# Patient Record
Sex: Female | Born: 1995 | Race: White | Hispanic: No | Marital: Single | Smoking: Current some day smoker
Health system: Southern US, Community
[De-identification: ages and names within clinical notes are randomized; demographics above are authoritative.]

---
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
## 2013-07-19 ENCOUNTER — Ambulatory Visit (INDEPENDENT_AMBULATORY_CARE_PROVIDER_SITE_OTHER): Payer: BC Managed Care – PPO | Admitting: Physician Assistant

## 2013-07-19 ENCOUNTER — Telehealth: Payer: Self-pay | Admitting: Physician Assistant

## 2013-07-19 VITALS — BP 110/72 | HR 78 | Temp 99.0°F | Resp 16 | Ht 62.0 in | Wt 112.2 lb

## 2013-07-19 DIAGNOSIS — R1031 Right lower quadrant pain: Secondary | ICD-10-CM

## 2013-07-19 LAB — COMPREHENSIVE METABOLIC PANEL
ALT: 10 U/L (ref 0–35)
AST: 15 U/L (ref 0–37)
Albumin: 4.1 g/dL (ref 3.5–5.2)
Alkaline Phosphatase: 41 U/L (ref 39–117)
BILIRUBIN TOTAL: 0.5 mg/dL (ref 0.2–1.2)
BUN: 6 mg/dL (ref 6–23)
CO2: 24 meq/L (ref 19–32)
Calcium: 9.3 mg/dL (ref 8.4–10.5)
Chloride: 105 mEq/L (ref 96–112)
Creat: 0.74 mg/dL (ref 0.50–1.10)
Glucose, Bld: 91 mg/dL (ref 70–99)
POTASSIUM: 4.5 meq/L (ref 3.5–5.3)
Sodium: 136 mEq/L (ref 135–145)
Total Protein: 6.6 g/dL (ref 6.0–8.3)

## 2013-07-19 LAB — CBC
HCT: 38.7 % (ref 36.0–46.0)
HEMOGLOBIN: 13.1 g/dL (ref 12.0–15.0)
MCH: 30.5 pg (ref 26.0–34.0)
MCHC: 33.9 g/dL (ref 30.0–36.0)
MCV: 90 fL (ref 78.0–100.0)
Platelets: 323 10*3/uL (ref 150–400)
RBC: 4.3 MIL/uL (ref 3.87–5.11)
RDW: 12.6 % (ref 11.5–15.5)
WBC: 5.1 10*3/uL (ref 4.0–10.5)

## 2013-07-19 LAB — POCT URINALYSIS DIPSTICK
BILIRUBIN UA: NEGATIVE
GLUCOSE UA: NEGATIVE
KETONES UA: NEGATIVE
Nitrite, UA: NEGATIVE
Protein, UA: NEGATIVE
RBC UA: NEGATIVE
SPEC GRAV UA: 1.01
UROBILINOGEN UA: 0.2
pH, UA: 6

## 2013-07-19 LAB — POCT URINE PREGNANCY: Preg Test, Ur: NEGATIVE

## 2013-07-19 MED ORDER — HYDROCODONE-ACETAMINOPHEN 5-325 MG PO TABS: 1.0000 | ORAL_TABLET | Freq: Four times a day (QID) | ORAL | Status: DC | PRN

## 2013-07-19 NOTE — Patient Instructions (Signed)
Use the pain medicine every 6 hours as needed (take with some food)  Stay hydrated by drinking water  I will call you with lab results and let you know if we need to do anything based on that today  If any of your symptoms are worsening (worsening abdominal pain, fever, nausea/vomiting) please call us, or if we are closed go to the emergency department  Plan to recheck at 8am tomorrow unless you hear otherwise from us.  If no better or worsening tomorrow, we may need to do imagina (CT or ultrasound)

## 2013-07-19 NOTE — Telephone Encounter (Signed)
error 

## 2013-07-19 NOTE — Progress Notes (Signed)
Subjective:    Patient ID: Rebekah Avery, female    DOB: Oct 14, 1995, 18 y.o.   MRN: [id number]  HPI   Rebekah Avery is a very pleasant 18 yr old female here with onset of RLQ abdominal pain yesterday.  This happened rather suddenly at work yesterday but has been worsening since.  She only has pain in the RLQ.  Woke up in the night with pain.  Took ibuprofen with some relief.  Laying down improves the pain somewhat.  She denies NVD.  She has not eaten today because she has not felt hungry.  No fever - temp 44F at triage - mom states "her normal is 58F".  Pt has never had pain like this before.  She denies urinary symptoms.  She denies abnormal vaginal discharge or bleeding.  She is sexually active with 1 female partner - no condoms, but OCPs.  LMP 07/05/13 - regular every month.  No concern for STI.  Last tested 4 months ago, same partner.     Review of Systems  Constitutional: Positive for appetite change and fatigue. Negative for fever and chills.  HENT: Negative.   Respiratory: Negative for cough, shortness of breath and wheezing.   Gastrointestinal: Positive for abdominal pain. Negative for nausea, vomiting and diarrhea.  Genitourinary: Negative for dysuria, flank pain, vaginal bleeding and vaginal discharge.  Skin: Negative.   Neurological: Negative for headaches.       Objective:   Physical Exam  Vitals reviewed. Constitutional: She is oriented to person, place, and time. She appears well-developed and well-nourished. No distress.  HENT:  Head: Normocephalic and atraumatic.  Eyes: Conjunctivae are normal. No scleral icterus.  Neck: Neck supple.  Cardiovascular: Normal rate, regular rhythm and normal heart sounds.   Pulmonary/Chest: Effort normal and breath sounds normal. She has no wheezes. She has no rales.  Abdominal: Soft. Bowel sounds are normal. There is tenderness in the right upper quadrant and right lower quadrant. There is tenderness at McBurney's point. There is no rigidity, no  rebound, no guarding and no CVA tenderness.  Significant RLQ tenderness; slight RUQ tenderness as well; no rebound but +Rosving  Genitourinary: There is no rash, tenderness or lesion on the right labia. There is no rash, tenderness or lesion on the left labia. Cervix exhibits no motion tenderness. Right adnexum displays no mass, no tenderness and no fullness. Left adnexum displays no mass, no tenderness and no fullness. No bleeding around the vagina.  Lymphadenopathy:    She has no cervical adenopathy.  Neurological: She is alert and oriented to person, place, and time.  Skin: Skin is warm and dry.  Psychiatric: She has a normal mood and affect. Her behavior is normal.    Results for orders placed in visit on 07/19/13  CBC      Result Value Ref Range   WBC 5.1  4.0 - 10.5 K/uL   RBC 4.30  3.87 - 5.11 MIL/uL   Hemoglobin 13.1  12.0 - 15.0 g/dL   HCT 40.9  81.1 - 91.4 %   MCV 90.0  78.0 - 100.0 fL   MCH 30.5  26.0 - 34.0 pg   MCHC 33.9  30.0 - 36.0 g/dL   RDW 78.2  95.6 - 21.3 %   Platelets 323  150 - 400 K/uL  COMPREHENSIVE METABOLIC PANEL      Result Value Ref Range   Sodium 136  135 - 145 mEq/L   Potassium 4.5  3.5 - 5.3 mEq/L   Chloride  105  96 - 112 mEq/L   CO2 24  19 - 32 mEq/L   Glucose, Bld 91  70 - 99 mg/dL   BUN 6  6 - 23 mg/dL   Creat 4.090.74  8.110.50 - 9.141.10 mg/dL   Total Bilirubin 0.5  0.2 - 1.2 mg/dL   Alkaline Phosphatase 41  39 - 117 U/L   AST 15  0 - 37 U/L   ALT 10  0 - 35 U/L   Total Protein 6.6  6.0 - 8.3 g/dL   Albumin 4.1  3.5 - 5.2 g/dL   Calcium 9.3  8.4 - 78.210.5 mg/dL  POCT URINE PREGNANCY      Result Value Ref Range   Preg Test, Ur Negative    POCT URINALYSIS DIPSTICK      Result Value Ref Range   Color, UA yellow     Clarity, UA clear     Glucose, UA neg     Bilirubin, UA neg     Ketones, UA neg     Spec Grav, UA 1.010     Blood, UA neg     pH, UA 6.0     Protein, UA neg     Urobilinogen, UA 0.2     Nitrite, UA neg     Leukocytes, UA Trace          Assessment & Plan:  RLQ abdominal pain - Plan: POCT urine pregnancy, POCT urinalysis dipstick, CBC, Comprehensive metabolic panel, HYDROcodone-acetaminophen (NORCO) 5-325 MG per tablet   Rebekah Avery is a very pleasant 18 yr old female here with 1 day of RLQ pain.  On exam she is very tender in the RLQ.  +Rosving.  +anorexia today, but no NV.  She is afebrile, CBC and CMP normal.  Hcg is neg and urine is clear.  Some concern for early appendicitis - but I do not feel that she warrants a CT scan at this point.  Ddx could include ovarian cyst or torsion, although pt MUCH more tender on abdominal exam than bimanual.  Discussed with pt by phone that with normal labs, I think we can watch symptoms closely.  Norco for pain.  Stay hydrated.  She will RTC first thing tomorrow morning to recheck with Rhoderick MoodyHeather Marte PA-C.  If worsening or not improving, would proceed with CT vs US.  We discussed symptoms that would prompt her to seek care sooner   E. Frances FurbishElizabeth Arin Vanosdol MHS, PA-C [facility] 7/12/20154:09 PM

## 2013-07-20 ENCOUNTER — Ambulatory Visit (HOSPITAL_COMMUNITY)
Admission: RE | Admit: 2013-07-20 | Discharge: 2013-07-20 | Disposition: A | Discharge status: Home / Self Care | Payer: BC Managed Care – PPO | Source: Ambulatory Visit | Attending: Physician Assistant | Admitting: Physician Assistant

## 2013-07-20 ENCOUNTER — Ambulatory Visit (INDEPENDENT_AMBULATORY_CARE_PROVIDER_SITE_OTHER): Payer: BC Managed Care – PPO | Admitting: Physician Assistant

## 2013-07-20 ENCOUNTER — Telehealth: Payer: Self-pay | Admitting: Physician Assistant

## 2013-07-20 ENCOUNTER — Encounter (HOSPITAL_COMMUNITY): Payer: Self-pay

## 2013-07-20 VITALS — BP 104/60 | HR 68 | Temp 98.3°F | Resp 16 | Ht 62.0 in | Wt 141.6 lb

## 2013-07-20 DIAGNOSIS — R1115 Cyclical vomiting syndrome unrelated to migraine: Secondary | ICD-10-CM

## 2013-07-20 DIAGNOSIS — R109 Unspecified abdominal pain: Secondary | ICD-10-CM

## 2013-07-20 DIAGNOSIS — R1031 Right lower quadrant pain: Secondary | ICD-10-CM

## 2013-07-20 DIAGNOSIS — R1110 Vomiting, unspecified: Secondary | ICD-10-CM

## 2013-07-20 LAB — CBC WITH DIFFERENTIAL/PLATELET
Basophils Absolute: 0 10*3/uL (ref 0.0–0.1)
Eosinophils Absolute: 0 10*3/uL (ref 0.0–0.7)
HCT: 41 % (ref 36.0–46.0)
Hemoglobin: 12.9 g/dL (ref 12.0–15.0)
Lymphocytes Relative: 18 % (ref 12–46)
Lymphs Abs: 1.8 10*3/uL (ref 0.7–4.0)
MCH: 30 pg (ref 26.0–34.0)
MCHC: 31.5 g/dL (ref 30.0–36.0)
MCV: 95.3 fL (ref 78.0–100.0)
Monocytes Absolute: 0.5 10*3/uL (ref 0.1–1.0)
Monocytes Relative: 5 % (ref 3–12)
Neutro Abs: 7.9 10*3/uL — ABNORMAL HIGH (ref 1.7–7.7)
Neutrophils Relative %: 77 % (ref 43–77)
Platelets: 269 10*3/uL (ref 150–400)
RBC: 4.3 MIL/uL (ref 3.87–5.11)
RDW: 12 % (ref 11.5–15.5)
WBC: 10.2 10*3/uL (ref 4.0–10.5)

## 2013-07-20 LAB — POCT URINALYSIS DIPSTICK
Bilirubin, UA: NEGATIVE
Blood, UA: NEGATIVE
Glucose, UA: NEGATIVE
Ketones, UA: 40
Leukocytes, UA: NEGATIVE
Nitrite, UA: NEGATIVE
Protein, UA: NEGATIVE
Spec Grav, UA: <=1.005
Urobilinogen, UA: 0.2
pH, UA: 6.5

## 2013-07-20 LAB — POCT UA - MICROSCOPIC ONLY
Bacteria, U Microscopic: NEGATIVE
Casts, Ur, LPF, POC: NEGATIVE
Crystals, Ur, HPF, POC: NEGATIVE
Mucus, UA: NEGATIVE
RBC, urine, microscopic: NEGATIVE
WBC, Ur, HPF, POC: NEGATIVE
Yeast, UA: NEGATIVE

## 2013-07-20 MED ORDER — CIPROFLOXACIN HCL 500 MG PO TABS: 500.0000 mg | ORAL_TABLET | Freq: Two times a day (BID) | ORAL | Status: DC

## 2013-07-20 MED ORDER — ONDANSETRON 4 MG PO TBDP
4.0000 mg | ORAL_TABLET | Freq: Once | ORAL | Status: AC
  Administered 2013-07-20: 4 mg via ORAL

## 2013-07-20 MED ORDER — CEFTRIAXONE SODIUM 1 G IJ SOLR
1.0000 g | Freq: Once | INTRAMUSCULAR | Status: AC
  Administered 2013-07-20: 1 g via INTRAMUSCULAR

## 2013-07-20 MED ORDER — IOHEXOL 300 MG/ML  SOLN
100.0000 mL | Freq: Once | INTRAMUSCULAR | Status: AC | PRN
  Administered 2013-07-20: 100 mL via INTRAVENOUS

## 2013-07-20 MED ORDER — ONDANSETRON 4 MG PO TBDP: 4.0000 mg | ORAL_TABLET | Freq: Three times a day (TID) | ORAL | Status: DC | PRN

## 2013-07-20 NOTE — Progress Notes (Deleted)
   Subjective:    Patient ID: Rebekah SkeensKira Avery, female    DOB: 01-Mar-1995, 18 y.o.   MRN: [id number]  HPI 18 year old     Review of Systems     Objective:   Physical Exam        Assessment & Plan:

## 2013-07-20 NOTE — Progress Notes (Signed)
   Subjective:    Patient ID: Rebekah Avery, female    DOB: 1995-02-13, 18 y.o.   MRN: [id number]  HPI 18 year old female presents today for recheck of RLQ abdominal pain. Symptoms started on 7/11 and have been progressively worsening. Was evaluated yesterday and since patient afebrile without N/V/F/C, conservative tx and close f/u implemented.  Today, she has worsening RLQ pain and anorexia. Continues to be afebrile without N/V, but mother is concerned about the worsening pain.    Labs yesterday WNL; normal CBC, CMET, UA.  HCG neg.     Review of Systems  Constitutional: Positive for appetite change (anorexia). Negative for fever and chills.  Gastrointestinal: Positive for abdominal pain (RLQ). Negative for nausea and vomiting.       Objective:   Physical Exam  Constitutional: She is oriented to person, place, and time. She appears well-developed and well-nourished.  HENT:  Head: Normocephalic and atraumatic.  Right Ear: External ear normal.  Left Ear: External ear normal.  Eyes: Conjunctivae are normal.  Neck: Normal range of motion.  Cardiovascular: Normal rate.   Pulmonary/Chest: Effort normal.  Abdominal: There is tenderness in the right lower quadrant. There is guarding. There is no rigidity and no rebound.  Neurological: She is alert and oriented to person, place, and time.  Psychiatric: She has a normal mood and affect. Her behavior is normal. Judgment and thought content normal.          Assessment & Plan:  RLQ abdominal pain - Plan: CANCELED: CT Abdomen Pelvis W Contrast  Sent for CT scan abdo/pelvis to r/o appendicitis. Will await results to determine tx.

## 2013-07-20 NOTE — Addendum Note (Signed)
Addended by: Nelva NayMARTE, Trevion Hoben M on: 07/20/2013 04:03 PM   Modules accepted: Orders, Level of Service

## 2013-07-20 NOTE — Telephone Encounter (Signed)
Spoke with patient and her mother. CT shows possible early pyelonephritis. Patient has no dysuria or frequency but now has slight right sided flank pain and chills. She will return to have a UA/urine culture and CBC checked today. Will plan to go ahead and start on Cipro 500 mg bid x 7 days and recheck in 24 hours

## 2013-07-20 NOTE — Progress Notes (Signed)
Patient returned for recheck after CT. Has developed nausea with 2 episodes of vomiting. Also c/o right sided flank pain and associated chills.    On exam she does have mild right CVA tenderness and low grade temp at 99.0.   Will treat for suspected early pyelonephritis with Rocephin 1 gram IM today and Cipro 500 mg bid x 7 days. Recheck in 24 hours. Out of work tomorrow. To ER if acutely worse through the night.

## 2013-07-22 LAB — URINE CULTURE
Colony Count: NO GROWTH
Organism ID, Bacteria: NO GROWTH

## 2014-06-26 ENCOUNTER — Ambulatory Visit (INDEPENDENT_AMBULATORY_CARE_PROVIDER_SITE_OTHER): Payer: 59

## 2014-06-26 ENCOUNTER — Ambulatory Visit (INDEPENDENT_AMBULATORY_CARE_PROVIDER_SITE_OTHER): Payer: 59 | Admitting: Family Medicine

## 2014-06-26 VITALS — BP 124/78 | HR 73 | Temp 98.4°F | Resp 16 | Ht 62.0 in | Wt 132.0 lb

## 2014-06-26 DIAGNOSIS — M79675 Pain in left toe(s): Secondary | ICD-10-CM | POA: Diagnosis not present

## 2014-06-26 DIAGNOSIS — M25572 Pain in left ankle and joints of left foot: Secondary | ICD-10-CM

## 2014-06-26 NOTE — Patient Instructions (Signed)
Your left foot xray did not show any acute injury.   Acute Ankle Sprain with Phase I Rehab An acute ankle sprain is a partial or complete tear in one or more of the ligaments of the ankle due to traumatic injury. The severity of the injury depends on both the number of ligaments sprained and the grade of sprain. There are 3 grades of sprains.   A grade 1 sprain is a mild sprain. There is a slight pull without obvious tearing. There is no loss of strength, and the muscle and ligament are the correct length.  A grade 2 sprain is a moderate sprain. There is tearing of fibers within the substance of the ligament where it connects two bones or two cartilages. The length of the ligament is increased, and there is usually decreased strength.  A grade 3 sprain is a complete rupture of the ligament and is uncommon. In addition to the grade of sprain, there are three types of ankle sprains.  Lateral ankle sprains: This is a sprain of one or more of the three ligaments on the outer side (lateral) of the ankle. These are the most common sprains. Medial ankle sprains: There is one large triangular ligament of the inner side (medial) of the ankle that is susceptible to injury. Medial ankle sprains are less common. Syndesmosis, "high ankle," sprains: The syndesmosis is the ligament that connects the two bones of the lower leg. Syndesmosis sprains usually only occur with very severe ankle sprains. SYMPTOMS  Pain, tenderness, and swelling in the ankle, starting at the side of injury that may progress to the whole ankle and foot with time.  "Pop" or tearing sensation at the time of injury.  Bruising that may spread to the heel.  Impaired ability to walk soon after injury. CAUSES   Acute ankle sprains are caused by trauma placed on the ankle that temporarily forces or pries the anklebone (talus) out of its normal socket.  Stretching or tearing of the ligaments that normally hold the joint in place (usually  due to a twisting injury). RISK INCREASES WITH:  Previous ankle sprain.  Sports in which the foot may land awkwardly (i.e., basketball, volleyball, or soccer) or walking or running on uneven or rough surfaces.  Shoes with inadequate support to prevent sideways motion when stress occurs.  Poor strength and flexibility.  Poor balance skills.  Contact sports. PREVENTION   Warm up and stretch properly before activity.  Maintain physical fitness:  Ankle and leg flexibility, muscle strength, and endurance.  Cardiovascular fitness.  Balance training activities.  Use proper technique and have a coach correct improper technique.  Taping, protective strapping, bracing, or high-top tennis shoes may help prevent injury. Initially, tape is best; however, it loses most of its support function within 10 to 15 minutes.  Wear proper-fitted protective shoes (High-top shoes with taping or bracing is more effective than either alone).  Provide the ankle with support during sports and practice activities for 12 months following injury. PROGNOSIS   If treated properly, ankle sprains can be expected to recover completely; however, the length of recovery depends on the degree of injury.  A grade 1 sprain usually heals enough in 5 to 7 days to allow modified activity and requires an average of 6 weeks to heal completely.  A grade 2 sprain requires 6 to 10 weeks to heal completely.  A grade 3 sprain requires 12 to 16 weeks to heal.  A syndesmosis sprain often takes more than 3  months to heal. RELATED COMPLICATIONS   Frequent recurrence of symptoms may result in a chronic problem. Appropriately addressing the problem the first time decreases the frequency of recurrence and optimizes healing time. Severity of the initial sprain does not predict the likelihood of later instability.  Injury to other structures (bone, cartilage, or tendon).  A chronically unstable or arthritic ankle joint is a  possibility with repeated sprains. TREATMENT Treatment initially involves the use of ice, medication, and compression bandages to help reduce pain and inflammation. Ankle sprains are usually immobilized in a walking cast or boot to allow for healing. Crutches may be recommended to reduce pressure on the injury. After immobilization, strengthening and stretching exercises may be necessary to regain strength and a full range of motion. Surgery is rarely needed to treat ankle sprains. MEDICATION   Nonsteroidal anti-inflammatory medications, such as aspirin and ibuprofen (do not take for the first 3 days after injury or within 7 days before surgery), or other minor pain relievers, such as acetaminophen, are often recommended. Take these as directed by your caregiver. Contact your caregiver immediately if any bleeding, stomach upset, or signs of an allergic reaction occur from these medications.  Ointments applied to the skin may be helpful.  Pain relievers may be prescribed as necessary by your caregiver. Do not take prescription pain medication for longer than 4 to 7 days. Use only as directed and only as much as you need. HEAT AND COLD  Cold treatment (icing) is used to relieve pain and reduce inflammation for acute and chronic cases. Cold should be applied for 10 to 15 minutes every 2 to 3 hours for inflammation and pain and immediately after any activity that aggravates your symptoms. Use ice packs or an ice massage.  Heat treatment may be used before performing stretching and strengthening activities prescribed by your caregiver. Use a heat pack or a warm soak. SEEK IMMEDIATE MEDICAL CARE IF:   Pain, swelling, or bruising worsens despite treatment.  You experience pain, numbness, discoloration, or coldness in the foot or toes.  New, unexplained symptoms develop (drugs used in treatment may produce side effects.) EXERCISES  PHASE I EXERCISES RANGE OF MOTION (ROM) AND STRETCHING EXERCISES -  Ankle Sprain, Acute Phase I, Weeks 1 to 2 These exercises may help you when beginning to restore flexibility in your ankle. You will likely work on these exercises for the 1 to 2 weeks after your injury. Once your physician, physical therapist, or athletic trainer sees adequate progress, he or she will advance your exercises. While completing these exercises, remember:   Restoring tissue flexibility helps normal motion to return to the joints. This allows healthier, less painful movement and activity.  An effective stretch should be held for at least 30 seconds.  A stretch should never be painful. You should only feel a gentle lengthening or release in the stretched tissue. RANGE OF MOTION - Dorsi/Plantar Flexion  While sitting with your right / left knee straight, draw the top of your foot upwards by flexing your ankle. Then reverse the motion, pointing your toes downward.  Hold each position for __________ seconds.  After completing your first set of exercises, repeat this exercise with your knee bent. Repeat __________ times. Complete this exercise __________ times per day.  RANGE OF MOTION - Ankle Alphabet  Imagine your right / left big toe is a pen.  Keeping your hip and knee still, write out the entire alphabet with your "pen." Make the letters as large as you  can without increasing any discomfort. Repeat __________ times. Complete this exercise __________ times per day.  STRENGTHENING EXERCISES - Ankle Sprain, Acute -Phase I, Weeks 1 to 2 These exercises may help you when beginning to restore strength in your ankle. You will likely work on these exercises for 1 to 2 weeks after your injury. Once your physician, physical therapist, or athletic trainer sees adequate progress, he or she will advance your exercises. While completing these exercises, remember:   Muscles can gain both the endurance and the strength needed for everyday activities through controlled exercises.  Complete  these exercises as instructed by your physician, physical therapist, or athletic trainer. Progress the resistance and repetitions only as guided.  You may experience muscle soreness or fatigue, but the pain or discomfort you are trying to eliminate should never worsen during these exercises. If this pain does worsen, stop and make certain you are following the directions exactly. If the pain is still present after adjustments, discontinue the exercise until you can discuss the trouble with your clinician. STRENGTH - Dorsiflexors  Secure a rubber exercise band/tubing to a fixed object (i.e., table, pole) and loop the other end around your right / left foot.  Sit on the floor facing the fixed object. The band/tubing should be slightly tense when your foot is relaxed.  Slowly draw your foot back toward you using your ankle and toes.  Hold this position for __________ seconds. Slowly release the tension in the band and return your foot to the starting position. Repeat __________ times. Complete this exercise __________ times per day.  STRENGTH - Plantar-flexors   Sit with your right / left leg extended. Holding onto both ends of a rubber exercise band/tubing, loop it around the ball of your foot. Keep a slight tension in the band.  Slowly push your toes away from you, pointing them downward.  Hold this position for __________ seconds. Return slowly, controlling the tension in the band/tubing. Repeat __________ times. Complete this exercise __________ times per day.  STRENGTH - Ankle Eversion  Secure one end of a rubber exercise band/tubing to a fixed object (table, pole). Loop the other end around your foot just before your toes.  Place your fists between your knees. This will focus your strengthening at your ankle.  Drawing the band/tubing across your opposite foot, slowly, pull your little toe out and up. Make sure the band/tubing is positioned to resist the entire motion.  Hold this  position for __________ seconds. Have your muscles resist the band/tubing as it slowly pulls your foot back to the starting position.  Repeat __________ times. Complete this exercise __________ times per day.  STRENGTH - Ankle Inversion  Secure one end of a rubber exercise band/tubing to a fixed object (table, pole). Loop the other end around your foot just before your toes.  Place your fists between your knees. This will focus your strengthening at your ankle.  Slowly, pull your big toe up and in, making sure the band/tubing is positioned to resist the entire motion.  Hold this position for __________ seconds.  Have your muscles resist the band/tubing as it slowly pulls your foot back to the starting position. Repeat __________ times. Complete this exercises __________ times per day.  STRENGTH - Towel Curls  Sit in a chair positioned on a non-carpeted surface.  Place your right / left foot on a towel, keeping your heel on the floor.  Pull the towel toward your heel by only curling your toes. Keep your  heel on the floor.  If instructed by your physician, physical therapist, or athletic trainer, add weight to the end of the towel. Repeat __________ times. Complete this exercise __________ times per day. Document Released: 07/26/2004 Document Revised: 05/11/2013 Document Reviewed: 04/08/2008 [facility] Patient Information 2015 [place], [place]. This information is not intended to replace advice given to you by your health care provider. Make sure you discuss any questions you have with your health care provider.

## 2014-06-26 NOTE — Progress Notes (Signed)
   Subjective:    Patient ID: Rebekah Avery, female    DOB: Nov 29, 1995, 19 y.o.   MRN: [id number]  Chief Complaint  Patient presents with  . Ankle Injury    Left/ 3 days ago   Medications, allergies, past medical history, surgical history, family history, social history and problem list reviewed and updated.   HPI  19 yof presents with left ankle pain.   At work 2 days ago tried stepping over bucket and when landed she inverted foot. Pain on outside ankle afterward. Continued working rest of shift.   She has a walking boot at home and wore all day yest. Also has ankle brace at home which she is wearing today. Able to bear weight gingerly. Walked into clinic today with mom's assistance.   Review of Systems No fevers, chills.     Objective:   Physical Exam  Constitutional: She is oriented to person, place, and time. She appears well-developed and well-nourished.  Non-toxic appearance. She does not have a sickly appearance. She does not appear ill. No distress.  BP 124/78 mmHg  Pulse 73  Temp(Src) 98.4 F (36.9 C) (Oral)  Resp 16  Ht 5\' 2"  (1.575 m)  Wt 132 lb (59.875 kg)  BMI 24.14 kg/m2  SpO2 99%  LMP 06/06/2014   Musculoskeletal:       Left ankle: She exhibits swelling. She exhibits normal range of motion and no ecchymosis. Tenderness. Lateral malleolus tenderness found. No medial malleolus, no head of 5th metatarsal and no proximal fibula tenderness found. Achilles tendon normal.       Left foot: There is tenderness and bony tenderness.       Feet:  Mild ttp left lateral malleolus. No ttp over dorsum. No laxity on joint testing. Neg squeeze test. Moderate TTP over mid shaft 5th metatarsal.   Neurological: She is alert and oriented to person, place, and time.   [facility] reading (PRIMARY) by  Dr. Katrinka Blazing. Left foot findings: Normal. No acute injury.      Assessment & Plan:   19 yof presents with left ankle pain.   Left ankle pain Pain in toe of left foot - Plan: DG Foot  Complete Left --xray of left foot to r/o metatarsal fx, xrays negative --note off work till Tuesday --RICE, start rotating between heat/ice tomorrow --start light rom as tolerated next few days --ankle brace as needed --rtc one week if not improving  Donnajean Lopes, PA-C Physician Assistant-Certified [facility]  06/26/2014 10:05 PM

## 2014-06-27 ENCOUNTER — Encounter: Payer: Self-pay | Admitting: Family Medicine

## 2014-06-27 NOTE — Progress Notes (Signed)
History and physical examinations reviewed with PA McVeigh.  Xray reviewed during visit. Agree with assessment and plan. Kristi Paulita Fujita, M.D. [facility] [address] [place], [place]  [address] [phone] phone [phone] fax

## 2015-03-23 ENCOUNTER — Other Ambulatory Visit: Payer: Self-pay | Admitting: Obstetrics and Gynecology

## 2015-03-23 ENCOUNTER — Other Ambulatory Visit (HOSPITAL_COMMUNITY)
Admission: RE | Admit: 2015-03-23 | Discharge: 2015-03-23 | Disposition: A | Discharge status: Home / Self Care | Payer: 59 | Source: Ambulatory Visit | Attending: Obstetrics and Gynecology | Admitting: Obstetrics and Gynecology

## 2015-03-23 DIAGNOSIS — Z01419 Encounter for gynecological examination (general) (routine) without abnormal findings: Secondary | ICD-10-CM | POA: Diagnosis present

## 2015-03-24 LAB — CYTOLOGY - PAP

## 2015-09-03 IMAGING — CT CT ABD-PELV W/ CM
2 of 4 series · 16 of 46 positions shown, 18 images · IV contrast (APPLIED)
Comparison: None.

CLINICAL DATA: Right lower quadrant abdominal pain, progressive
since 07/18/2013.

EXAM:
CT ABDOMEN AND PELVIS WITH CONTRAST
TECHNIQUE: Multidetector CT imaging of the abdomen and pelvis was performed
using the standard protocol following bolus administration of
intravenous contrast.
CONTRAST:  100mL OMNIPAQUE IOHEXOL 300 MG/ML  SOLN

[Series 2: abd/ pelvis 5.0 i30f 1 · axial · 0.60mm/px · z∈[-717,-342]mm · 13 of 83 slices shown, 15 images]
[im 4/83  soft-tissue]
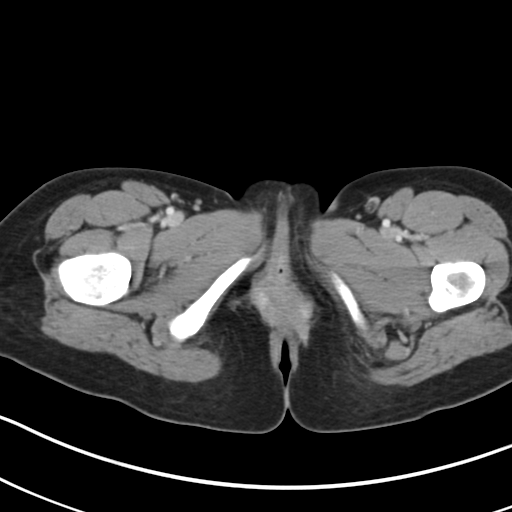
[im 4/83  bone]
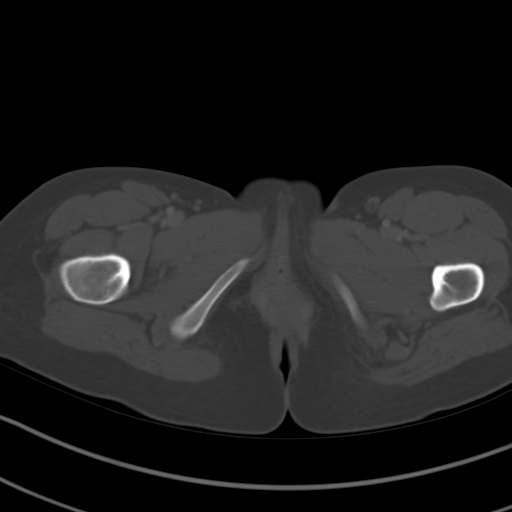
[im 10/83  soft-tissue]
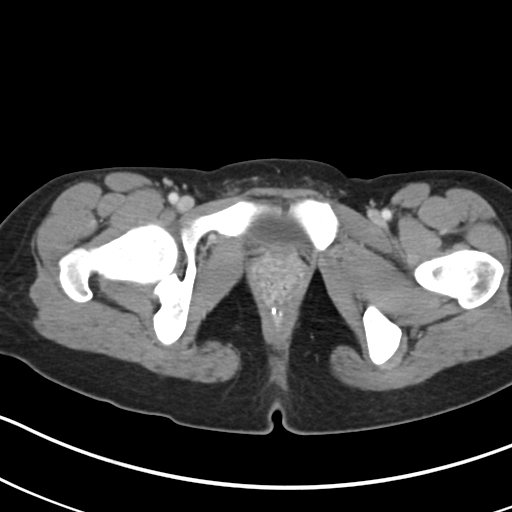
[im 16/83  soft-tissue]
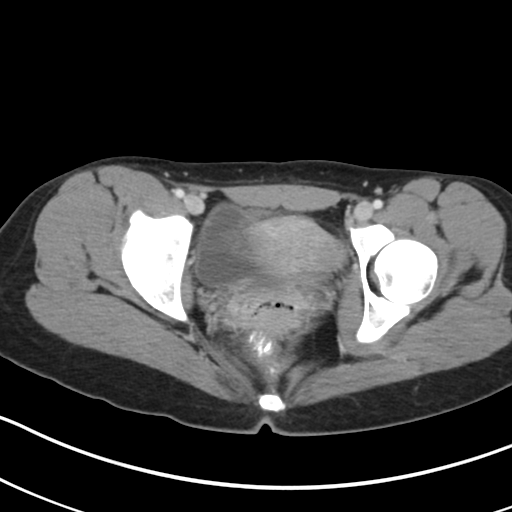
[im 23/83  soft-tissue]
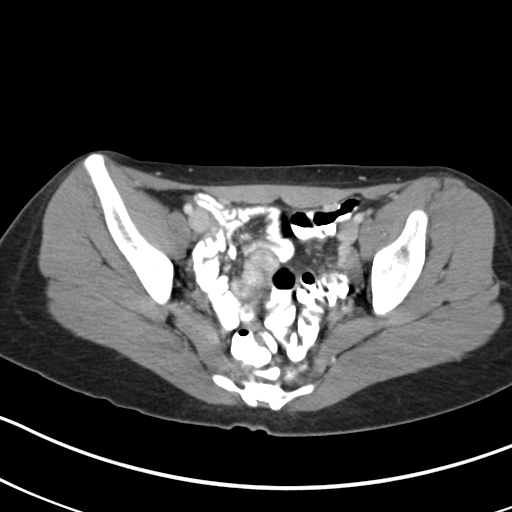
[im 29/83  soft-tissue]
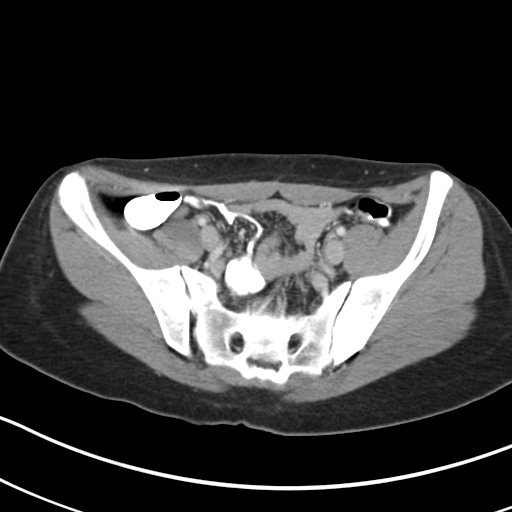
[im 35/83  soft-tissue]
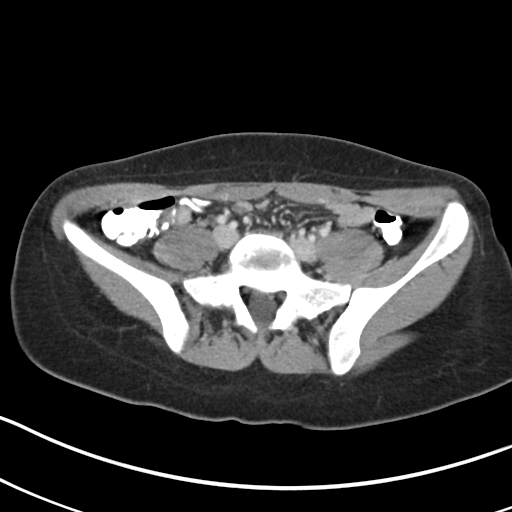
[im 42/83  soft-tissue]
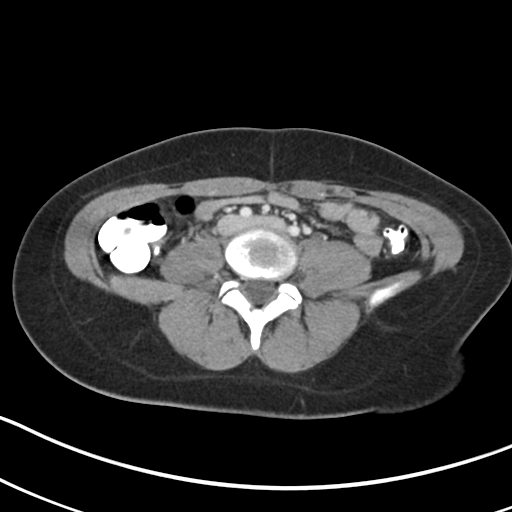
[im 48/83  soft-tissue]
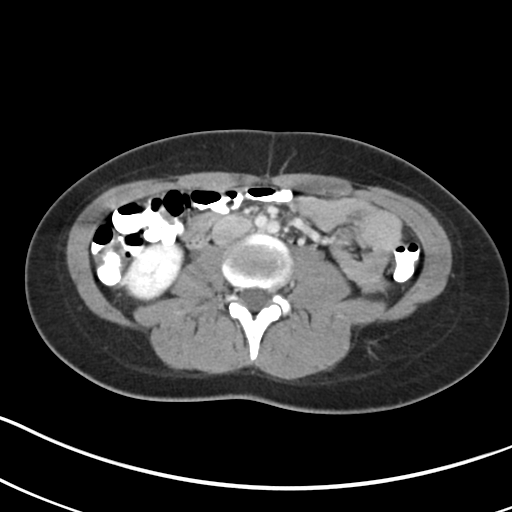
[im 54/83  soft-tissue]
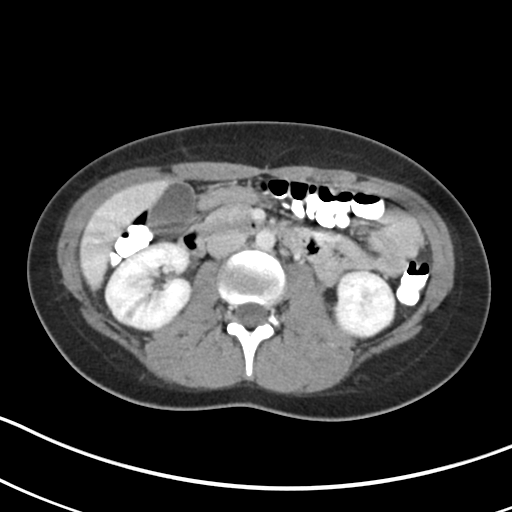
[im 54/83  bone]
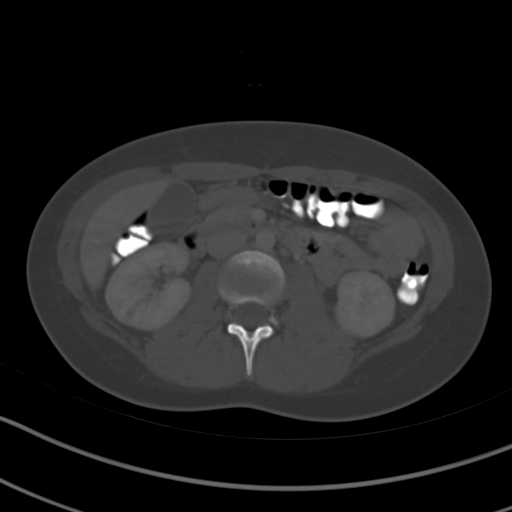
[im 60/83  soft-tissue]
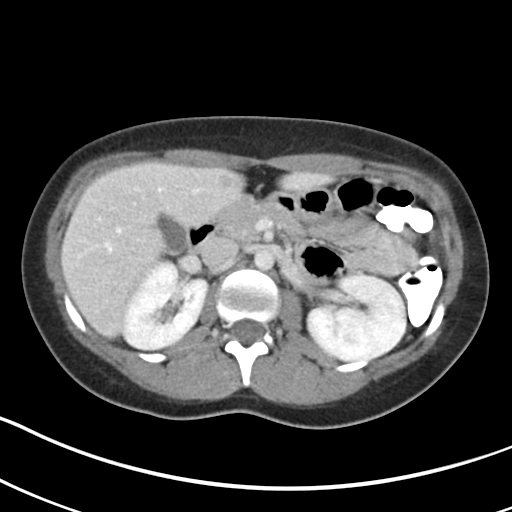
[im 67/83  soft-tissue]
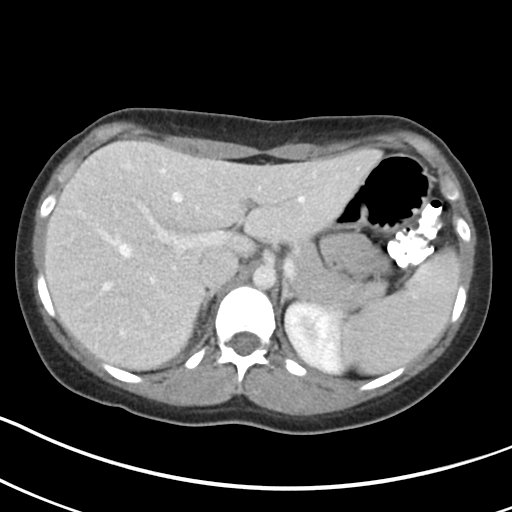
[im 73/83  soft-tissue]
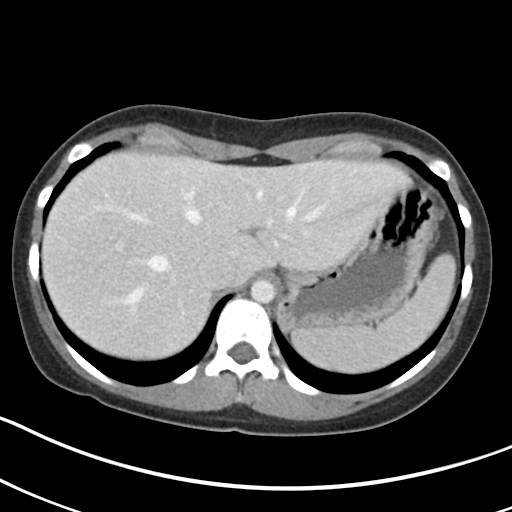
[im 79/83  soft-tissue]
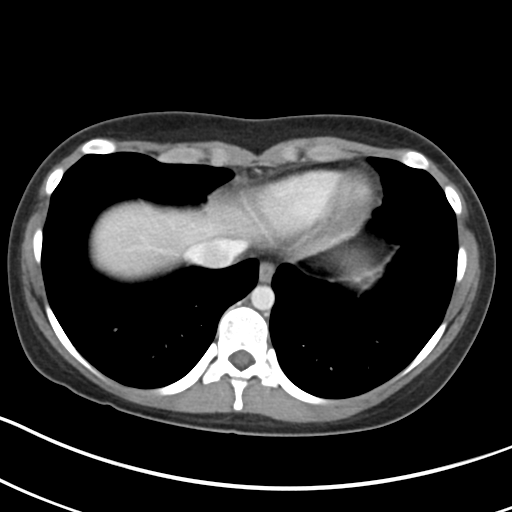

[Series 5: coronal soft tissue · coronal · 0.65mm/px · 3 of 61 slices shown]
[im 21/61  soft-tissue]
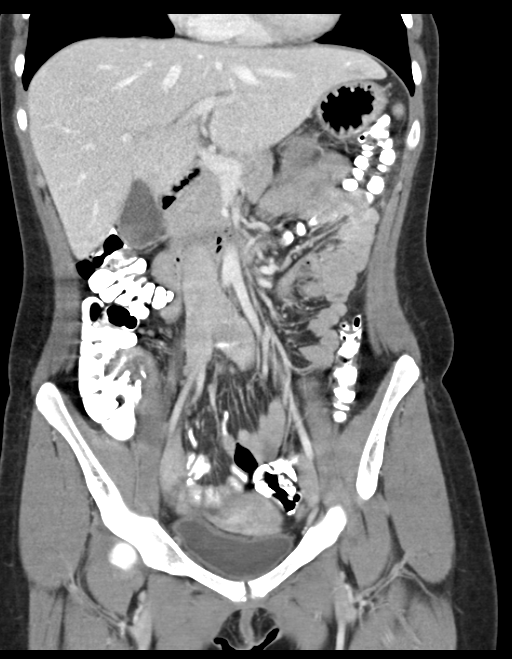
[im 27/61  soft-tissue]
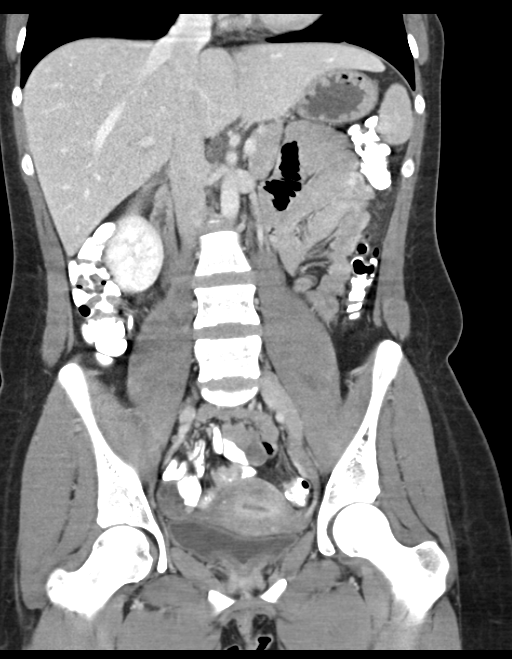
[im 34/61  soft-tissue]
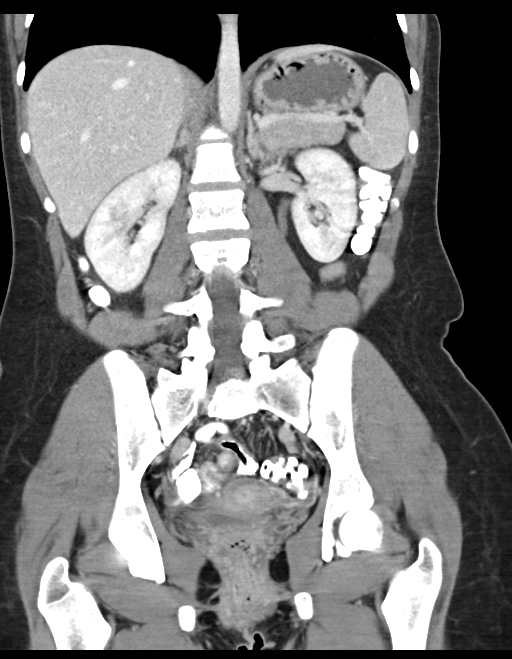

[16 of 46 positions shown; findings below may reference images not displayed]

FINDINGS: There are 2 streaky areas of abnormal decreased profusion in the mid
and lower pole of the right kidney. These are nonspecific but the
possibility of early pyelonephritis should be considered. There is
no perinephric soft tissue stranding. There is no hydronephrosis.

The liver, biliary tree, spleen, pancreas, and adrenal glands are
normal. Left kidney is normal.

The bowel is normal including the terminal ileum and appendix.
Uterus and ovaries are normal. Bladder is normal. There is no free
air or free fluid in the abdomen. There are no dilated loops of
bowel. No osseous abnormality.
IMPRESSION: Two subtle linear areas of decreased perfusion in the right kidney
which raise the possibility of early pyelonephritis. Otherwise,
normal exam.

## 2016-08-09 IMAGING — CR DG FOOT COMPLETE 3+V*L*
3 series · 3 of 3 positions shown · non-contrast
Comparison: None.

CLINICAL DATA: Pain following fall 3 days prior

EXAM:
LEFT FOOT - COMPLETE 3+ VIEW

[AP]
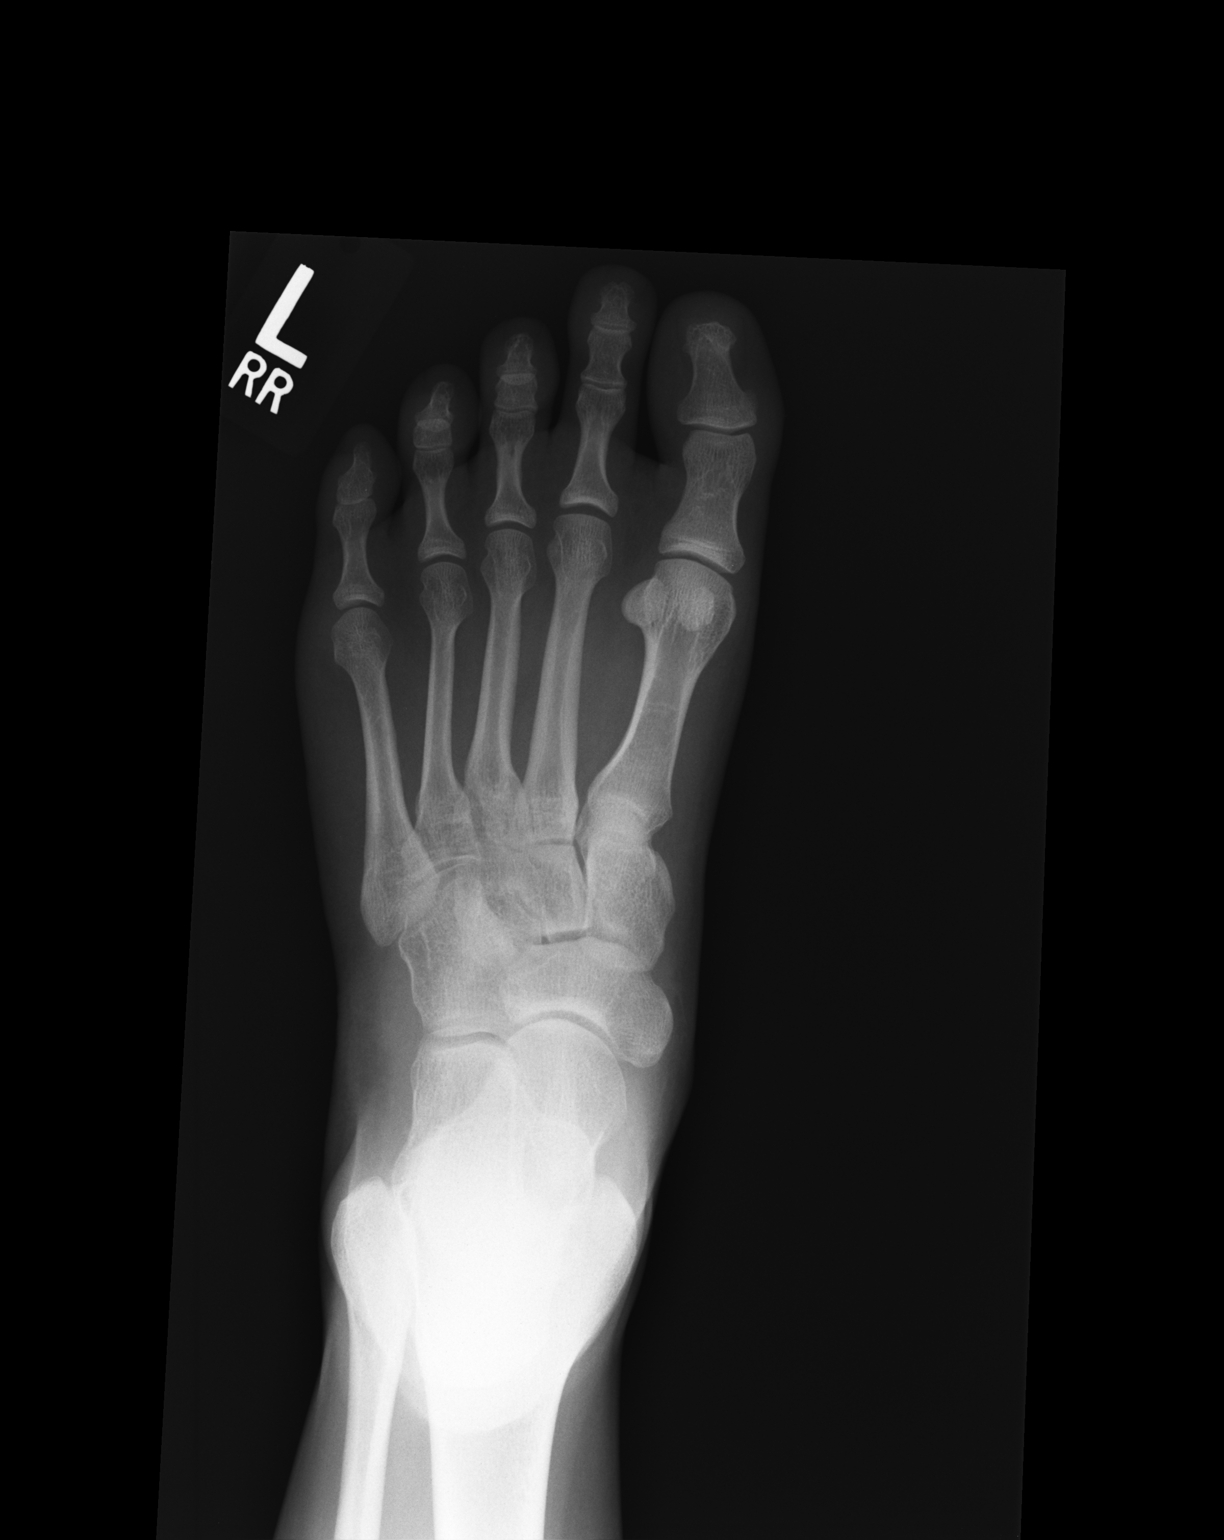

[ap obl int rot]
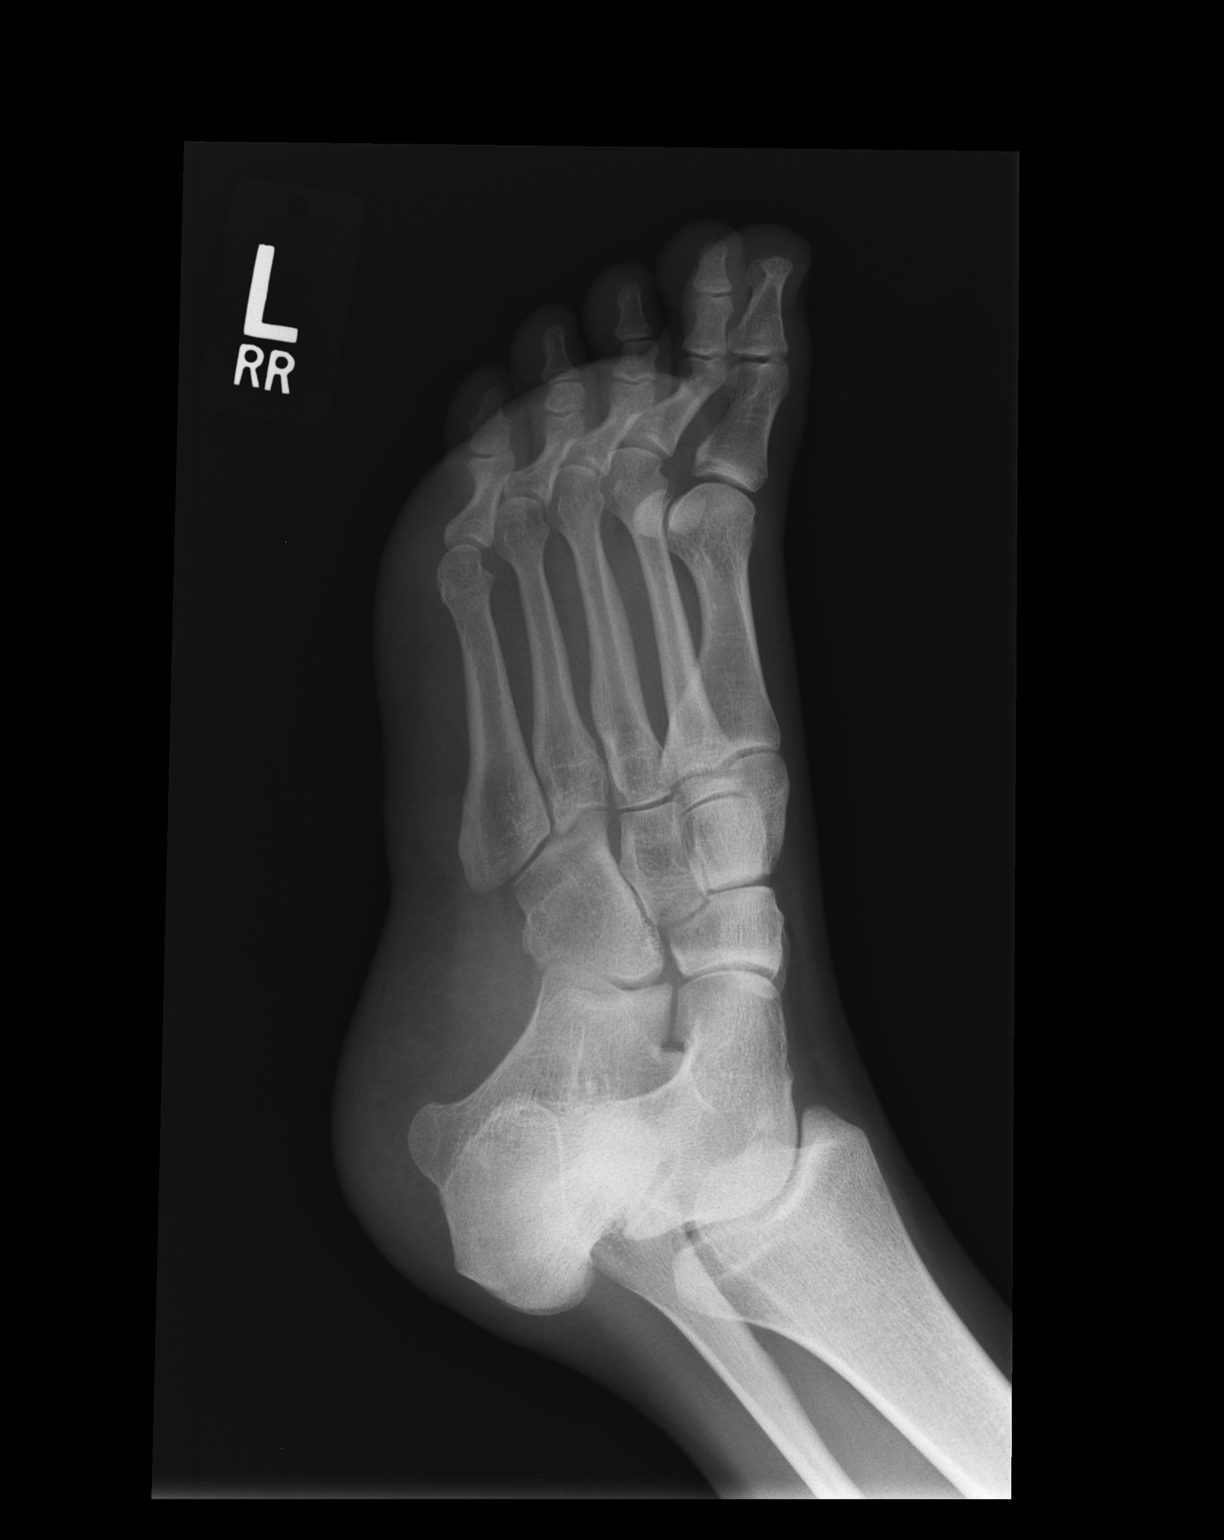

[lateral]
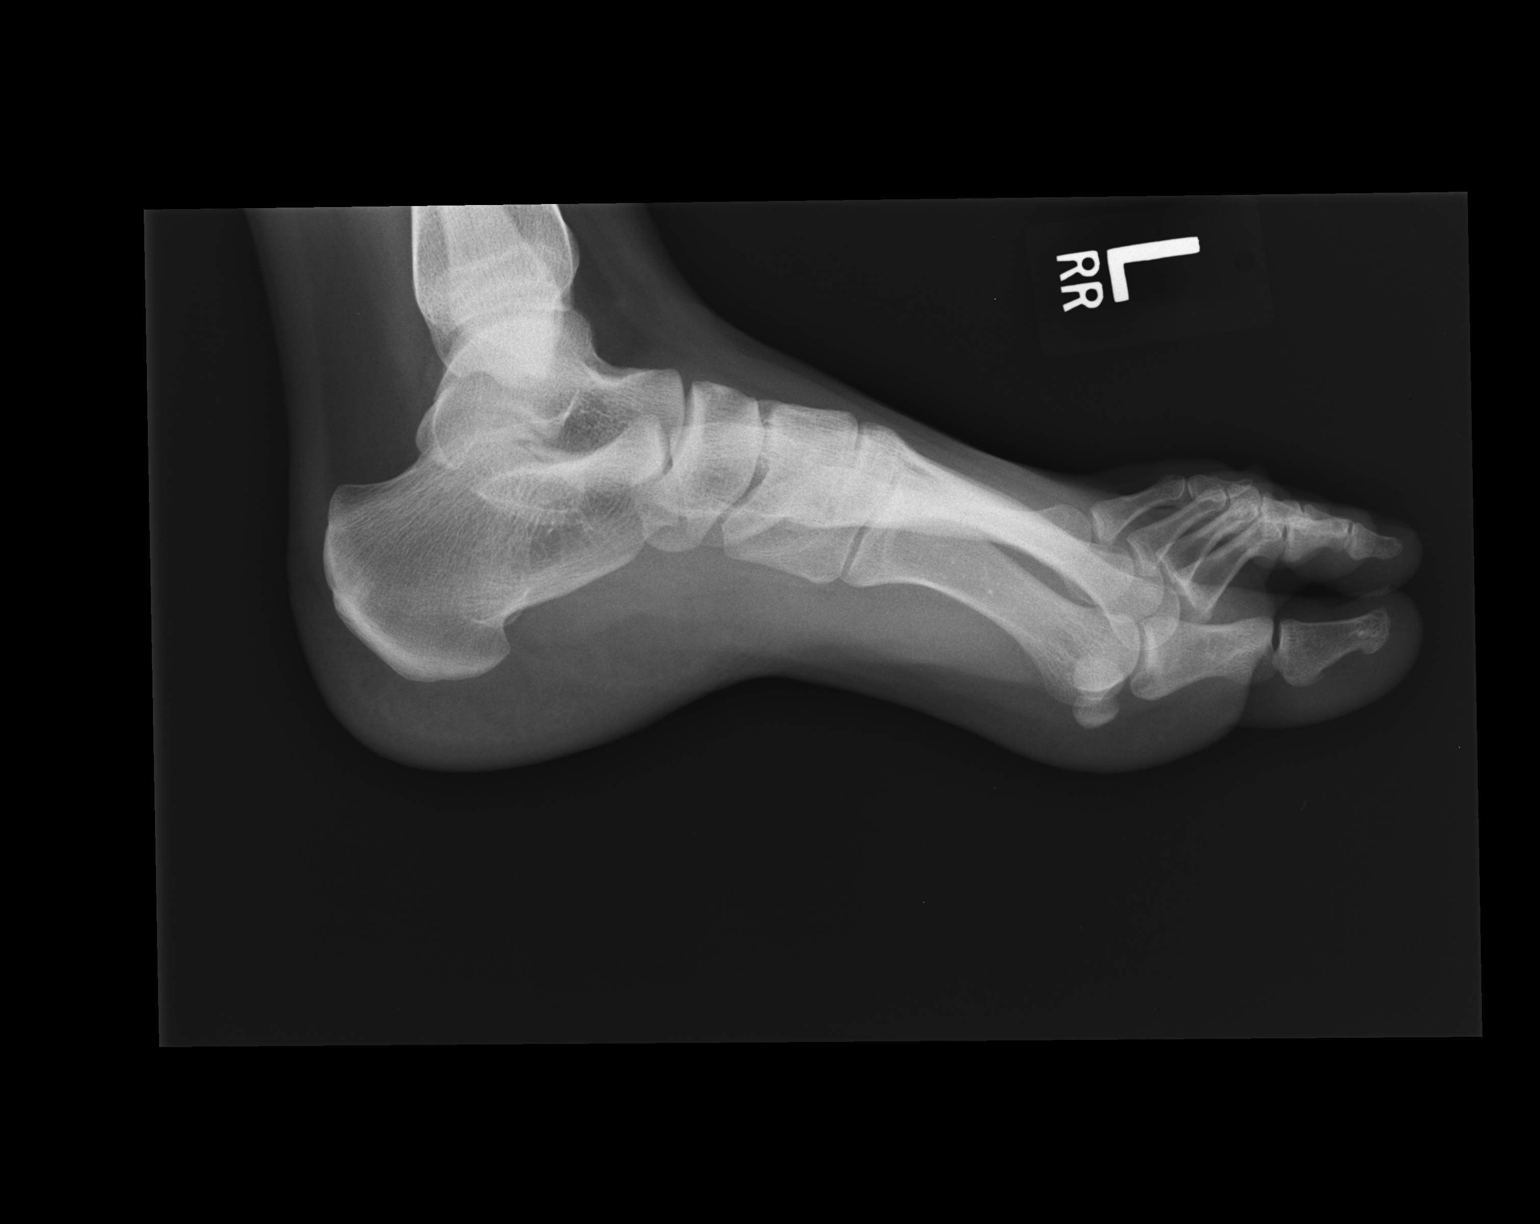

[3 of 3 positions shown; findings below may reference images not displayed]

FINDINGS: Frontal, oblique, and lateral views obtained. There is no fracture
or dislocation. Joint spaces appear intact. No erosive change.
IMPRESSION: No fracture or dislocation.  No appreciable arthropathy.

## 2017-06-04 ENCOUNTER — Encounter (HOSPITAL_COMMUNITY): Payer: Self-pay

## 2017-06-04 ENCOUNTER — Inpatient Hospital Stay (HOSPITAL_COMMUNITY)
Admission: AD | Admit: 2017-06-04 | Discharge: 2017-06-06 | DRG: 776 | Disposition: A | Discharge status: Home / Self Care | Payer: 59 | Source: Ambulatory Visit | Attending: Obstetrics and Gynecology | Admitting: Obstetrics and Gynecology

## 2017-06-04 LAB — TYPE AND SCREEN
ABO/RH(D): A POS
ANTIBODY SCREEN: NEGATIVE

## 2017-06-04 LAB — RAPID URINE DRUG SCREEN, HOSP PERFORMED
Amphetamines: NOT DETECTED
BENZODIAZEPINES: NOT DETECTED
Barbiturates: NOT DETECTED
Cocaine: NOT DETECTED
Opiates: NOT DETECTED
Tetrahydrocannabinol: POSITIVE — AB

## 2017-06-04 LAB — CBC
HEMATOCRIT: 39.8 % (ref 36.0–46.0)
Hemoglobin: 13.1 g/dL (ref 12.0–15.0)
MCH: 30 pg (ref 26.0–34.0)
MCHC: 32.9 g/dL (ref 30.0–36.0)
MCV: 91.3 fL (ref 78.0–100.0)
Platelets: 294 10*3/uL (ref 150–400)
RBC: 4.36 MIL/uL (ref 3.87–5.11)
RDW: 14.2 % (ref 11.5–15.5)
WBC: 23.8 10*3/uL — AB (ref 4.0–10.5)

## 2017-06-04 LAB — RPR: RPR Ser Ql: NONREACTIVE

## 2017-06-04 LAB — RAPID HIV SCREEN (HIV 1/2 AB+AG)
HIV 1/2 ANTIBODIES: NONREACTIVE
HIV-1 P24 ANTIGEN - HIV24: NONREACTIVE

## 2017-06-04 LAB — ABO/RH: ABO/RH(D): A POS

## 2017-06-04 MED ORDER — ACETAMINOPHEN 325 MG PO TABS
650.0000 mg | ORAL_TABLET | ORAL | Status: DC | PRN
  Administered 2017-06-04 – 2017-06-05 (×2): 650 mg via ORAL

## 2017-06-04 MED ORDER — OXYTOCIN 40 UNITS IN LACTATED RINGERS INFUSION - SIMPLE MED
2.5000 [IU]/h | INTRAVENOUS | Status: DC
  Administered 2017-06-04: 2.5 [IU]/h via INTRAVENOUS

## 2017-06-04 MED ORDER — WITCH HAZEL-GLYCERIN EX PADS: 1.0000 "application " | MEDICATED_PAD | CUTANEOUS | Status: DC | PRN

## 2017-06-04 MED ORDER — BENZOCAINE-MENTHOL 20-0.5 % EX AERO: 1.0000 "application " | INHALATION_SPRAY | CUTANEOUS | Status: DC | PRN

## 2017-06-04 MED ORDER — ONDANSETRON HCL 4 MG PO TABS: 4.0000 mg | ORAL_TABLET | ORAL | Status: DC | PRN

## 2017-06-04 MED ORDER — OXYTOCIN BOLUS FROM INFUSION
500.0000 mL | Freq: Once | INTRAVENOUS | Status: AC
  Administered 2017-06-04: 500 mL via INTRAVENOUS

## 2017-06-04 MED ORDER — ONDANSETRON HCL 4 MG/2ML IJ SOLN: 4.0000 mg | Freq: Four times a day (QID) | INTRAMUSCULAR | Status: DC | PRN

## 2017-06-04 MED ORDER — ZOLPIDEM TARTRATE 5 MG PO TABS: 5.0000 mg | ORAL_TABLET | Freq: Every evening | ORAL | Status: DC | PRN

## 2017-06-04 MED ORDER — PRENATAL MULTIVITAMIN CH
1.0000 | ORAL_TABLET | Freq: Every day | ORAL | Status: DC
  Administered 2017-06-04 – 2017-06-06 (×3): 1 via ORAL
  Filled 2017-06-04 (×3): qty 1

## 2017-06-04 MED ORDER — TETANUS-DIPHTH-ACELL PERTUSSIS 5-2.5-18.5 LF-MCG/0.5 IM SUSP: 0.5000 mL | Freq: Once | INTRAMUSCULAR | Status: DC

## 2017-06-04 MED ORDER — ACETAMINOPHEN 325 MG PO TABS
650.0000 mg | ORAL_TABLET | ORAL | Status: DC | PRN
  Filled 2017-06-04 (×2): qty 2

## 2017-06-04 MED ORDER — DIBUCAINE 1 % RE OINT: 1.0000 "application " | TOPICAL_OINTMENT | RECTAL | Status: DC | PRN

## 2017-06-04 MED ORDER — COCONUT OIL OIL
1.0000 "application " | TOPICAL_OIL | Status: DC | PRN
  Administered 2017-06-06: 1 via TOPICAL
  Filled 2017-06-04: qty 120

## 2017-06-04 MED ORDER — DIPHENHYDRAMINE HCL 25 MG PO CAPS: 25.0000 mg | ORAL_CAPSULE | Freq: Four times a day (QID) | ORAL | Status: DC | PRN

## 2017-06-04 MED ORDER — SOD CITRATE-CITRIC ACID 500-334 MG/5ML PO SOLN: 30.0000 mL | ORAL | Status: DC | PRN

## 2017-06-04 MED ORDER — IBUPROFEN 600 MG PO TABS
600.0000 mg | ORAL_TABLET | Freq: Four times a day (QID) | ORAL | Status: DC
  Administered 2017-06-04 – 2017-06-06 (×10): 600 mg via ORAL
  Filled 2017-06-04 (×10): qty 1

## 2017-06-04 MED ORDER — OXYTOCIN 40 UNITS IN LACTATED RINGERS INFUSION - SIMPLE MED
INTRAVENOUS | Status: AC
  Administered 2017-06-04: 500 mL via INTRAVENOUS
  Filled 2017-06-04: qty 1000

## 2017-06-04 MED ORDER — SIMETHICONE 80 MG PO CHEW: 80.0000 mg | CHEWABLE_TABLET | ORAL | Status: DC | PRN

## 2017-06-04 MED ORDER — SENNOSIDES-DOCUSATE SODIUM 8.6-50 MG PO TABS
2.0000 | ORAL_TABLET | ORAL | Status: DC
  Administered 2017-06-05 – 2017-06-06 (×2): 2 via ORAL
  Filled 2017-06-04 (×2): qty 2

## 2017-06-04 MED ORDER — LIDOCAINE HCL (PF) 1 % IJ SOLN
30.0000 mL | INTRAMUSCULAR | Status: DC | PRN
  Filled 2017-06-04: qty 30

## 2017-06-04 MED ORDER — ONDANSETRON HCL 4 MG/2ML IJ SOLN: 4.0000 mg | INTRAMUSCULAR | Status: DC | PRN

## 2017-06-04 NOTE — H&P (Addendum)
LABOR AND DELIVERY ADMISSION HISTORY AND PHYSICAL NOTE  Rebekah Avery is a 22 y.o. female G2P1 presented after a home delivery of a live infant. Patient states that she did not know she was pregnant, had been on birth control pills. She states that she was having some bleeding and cramping that she thought was due to spotting when she felt pressure. She states that she delivered her baby at home in the bathtub around 2:50am.  Prenatal History/Complications: no prenatal care  Past Medical History: none per patient No past medical history on file.  Past Surgical History: non per patient No past surgical history on file.  Obstetrical History: OB History    Gravida  2   Para  1   Term      Preterm      AB      Living        SAB      TAB      Ectopic      Multiple      Live Births              Social History: Admits to marijuana use. Social History   Socioeconomic History  . Marital status: Single    Spouse name: Not on file  . Number of children: Not on file  . Years of education: Not on file  . Highest education level: Not on file  Occupational History  . Not on file  Social Needs  . Financial resource strain: Not on file  . Food insecurity:    Worry: Not on file    Inability: Not on file  . Transportation needs:    Medical: Not on file    Non-medical: Not on file  Tobacco Use  . Smoking status: Current Some Day Smoker  Substance and Sexual Activity  . Alcohol use: Yes    Alcohol/week: 14.4 oz    Types: 24 Standard drinks or equivalent per week  . Drug use: No  . Sexual activity: Not on file  Lifestyle  . Physical activity:    Days per week: Not on file    Minutes per session: Not on file  . Stress: Not on file  Relationships  . Social connections:    Talks on phone: Not on file    Gets together: Not on file    Attends religious service: Not on file    Active member of club or organization: Not on file    Attends meetings of clubs or  organizations: Not on file    Relationship status: Not on file  Other Topics Concern  . Not on file  Social History Narrative  . Not on file    Family History: No family history on file.  Allergies: No Known Allergies  Medications Prior to Admission  Medication Sig Dispense Refill Last Dose  . BLISOVI 24 FE 1-20 MG-MCG(24) tablet 1 TABLET ONCE A DAY ORALLY 90 DAYS  4 Taking     Review of Systems   All systems reviewed and negative except as stated in HPI  Height  (1.549 m), weight 168 lb (76.2 kg). General appearance: alert, cooperative and no distress Lungs: no respiratory distress Heart: regular rate Abdomen: fundus firm Extremities: No calf swelling or tenderness Pelvic: nonbleeding bilateral labial lacerations   Prenatal Transfer Tool  No prenatal care Maternal Substance Abuse:  Yes:  Type: Marijuana Significant Maternal Medications:  None Significant Maternal Lab Results: Lab values include: Other:  GBS unknown  Assessment: Rebekah Avery  is a 22 y.o. G2P1 at Unknown here after a home delivery of a live female infant in the setting of no prenatal care. Placenta delivered. No repairs necessary of labial lacerations. - obtain CBC, HepBAg, HIV, RPR, Rubella, UDS - SW consult - transfer to postpartum  Leland Her, DO PGY-2 5/28/20193:39 AM  OB FELLOW HISTORY AND PHYSICAL ATTESTATION  I have seen and examined this patient; I agree with above documentation in the resident's note.    Frederik Pear, MD OB Fellow 06/04/2017, 3:57 AM

## 2017-06-04 NOTE — Lactation Note (Signed)
This note was copied from a baby's chart. Lactation Consultation Note  Patient Name: Rebekah Avery RUEAV'W Date: 06/04/2017 Reason for consult: Initial assessment;Primapara;1st time breastfeeding  Visited with P1 [name], that was born at home.  Mom unaware of pregnancy.   Baby has been sleepy today, but has latched recently.  Baby getting bath currently. Mom denies any difficulty with breastfeeding.   Encouraged STS, and feeding baby on cue when she is hungry.  To ask for help prn. Lactation brochure given.  Mom aware of Op lactation services available to her.   Consult Status Consult Status: Follow-up Date: 05/29/17 Follow-up type: In-patient    [name] 06/04/2017, 5:55 PM

## 2017-06-05 ENCOUNTER — Encounter (HOSPITAL_COMMUNITY): Payer: Self-pay | Admitting: *Deleted

## 2017-06-05 LAB — RUBELLA SCREEN

## 2017-06-05 NOTE — Clinical Social Work Maternal (Signed)
CLINICAL SOCIAL WORK MATERNAL/CHILD NOTE  Patient Details  Name: Rebekah Avery MRN: [id number] Date of Birth: 05/28/1995  Date:  06/05/2017  Clinical Social Worker Initiating Note:  Rebekah Cass, LCSW Date/Time: Initiated:  06/05/17/1145     Child's Name:  Rebekah Avery   Biological Parents:  Mother, Father(Rebekah Avery and Rebekah Avery)   Need for Interpreter:  None   Reason for Referral:  Late or No Prenatal Care    Address:  [address],  [place], [place]     Phone number:  [phone] (home)     Additional phone number:   Household Members/Support Persons (HM/SP):   Household Member/Support Person 1(Parents report that they will be moving in with FOB's parents, brother, brother's fiance and brother's 9 month old daughter until they can find a bigger house.)   HM/SP Name Relationship DOB or Age  HM/SP -1 Rebekah Avery FOB 07/18/88  HM/SP -2        HM/SP -3        HM/SP -4        HM/SP -5        HM/SP -6        HM/SP -7        HM/SP -8          Natural Supports (not living in the home):  Friends, Parent, Immediate Family, Extended Family(Parents report that they have a great support system)   Professional Supports: None   Employment:     Type of Work:     Education:      Homebound arranged:    Financial Resources:  Private Insurance   Other Resources:      Cultural/Religious Considerations Which May Impact Care: None stated.  MOB's facesheet notes religion as Non-Denominational.  Strengths:  Ability to meet basic needs , Home prepared for child , Pediatrician chosen   Psychotropic Medications:         Pediatrician:    (FOB states his mother has this information as it is the same pediatrician is neice goes to.  He states his mother will be coming to the hospital later today.)  Pediatrician List:   [place]    [place]    [place]    [place]     [place]    [place]      Pediatrician Fax Number:    Risk  Factors/Current Problems:  Substance Use (marijuana use in pregnancy (states she did not know she was pregnant))   Cognitive State:  Able to Concentrate , Goal Oriented , Insightful , Linear Thinking , Alert    Mood/Affect:  Calm , Interested , Euthymic    CSW Assessment: CSW met with MOB and FOB in room 132 to offer support and complete assessment for NPNC.  Chart notes that MOB reported that she did not know she was pregnant until she delivered.  MOB's UDS is positive for THC.  CSW spoke with RN prior to meeting with parents and asked if baby's urine was able to be collected since she was now 31 hours old and there had not been a screen done yet.  She states she needed to put cotton balls in the diaper.  CSW asked how many times baby has urinated since getting to the hospital.  RN looked and noted that bay has urinated multiple times.  Therefore, CSW asked that a UDS not be collected on baby and that CSW will monitor CDS results.   Parents were pleasant and welcoming of   CSW's visit.  MOB had just woken up and FOB was holding baby on the couch.  CSW offered to return at a later time as MOB still looked very sleepy and FOB stated that he and baby were "allowing mommy to get some rest," but MOB stated this was a good time to talk with them.  She stated that we could speak openly with FOB present.  They report being in a relationship for almost 4 years and seem supportive of each other.  FOB seems very bonded to baby in the way he held, looked at, and spoke about her.  CSW asked how they feel they are processing the birth of their baby, as they reported to staff that they didn't know MOB was pregnant.  They both report that it was a shock and MOB states that she did not realize that she was having a baby until she went to the bathroom thinking she was going to have a BM, but felt babies head crowning.  MOB states she doesn't think it feels completely real yet, but they are both very happy.  They report  great family support and everything they need for baby.  FOB explained that his brother and brother's fiance just had a baby girl 9 months ago and they recently moved her out of the nursery into a different room in the home.  This family lives with FOB's parents.  FOB states that he, MOB and baby will be moving in with his parents as well and baby will be using the nursery that is already fully set up.  He states they will be staying with his parents for support and space until they can find a bigger "baby suitable" home for their new family.   CSW spoke to MOB about marijuana use and her positive drug screen, acknowledging her report that she did not know she was pregnant.  MOB admits to smoking marijuana and states she told the doctor immediately and she wanted to be up front and honest.  She denies any other substance use-illicit or prescribed-in pregnancy.  She states she took one muscle relaxer a few days ago when she though she had food poisoning, but now thinks it was the start of labor.  Parents were completely understanding of hospital drug screen policy and mandated reporting for positive infant screens.   Parents were engaged and attentive to information given regarding PMADs and both deny any history of mental health concerns.  They thanked CSW for the visit and state no questions, concerns or needs at this time.  CSW Plan/Description:  No Further Intervention Required/No Barriers to Discharge, Sudden Infant Death Syndrome (SIDS) Education, Perinatal Mood and Anxiety Disorder (PMADs) Education, Hospital Drug Screen Policy Information, CSW Will Continue to Monitor Umbilical Cord Tissue Drug Screen Results and Make Report if Warranted    Rebekah Avery Elizabeth, LCSW 06/05/2017, 3:01 PM 

## 2017-06-05 NOTE — Progress Notes (Signed)
POSTPARTUM PROGRESS NOTE  Post Partum Day 1 Subjective:  Rebekah Avery is a 22 y.o. [id number] Unknown s/p SVD at home.  No acute events overnight.  Pt denies problems with ambulating, voiding or po intake.  She denies nausea or vomiting.  Pain is well controlled.  She has had flatus. She has had bowel movement.  Lochia Small.   Objective: Blood pressure 111/80, pulse (!) 56, temperature 97.9 F (36.6 C), temperature source Oral, resp. rate 18, height  (1.549 m), weight 168 lb (76.2 kg), unknown if currently breastfeeding.  Physical Exam:  General: alert, cooperative and no distress Lochia:normal flow Chest: CTAB Heart: RRR no m/r/g Abdomen: +BS, soft, nontender,  Uterine Fundus: firm,  DVT Evaluation: No calf swelling or tenderness Extremities: no edema  Recent Labs    06/04/17 0513  HGB 13.1  HCT 39.8   PNC on admission HIV, RPR are NR Hep B, Placenta path pending UDS + THC Rubella pending  Assessment/Plan:  ASSESSMENT: Rebekah Avery is a 22 y.o. G51P0011 Unknown s/p SVD at home w/ no PNC - PNC labs drawn on admission.  - SW consulted - [place] postpartum care  LOS: 1 day   Garnette Gunner, MD 06/05/2017, 9:31 AM

## 2017-06-06 MED ORDER — IBUPROFEN 600 MG PO TABS: 600.0000 mg | ORAL_TABLET | Freq: Four times a day (QID) | ORAL | 0 refills | Status: AC

## 2017-06-06 MED ORDER — MEASLES, MUMPS & RUBELLA VAC ~~LOC~~ INJ
0.5000 mL | INJECTION | Freq: Once | SUBCUTANEOUS | Status: AC
  Administered 2017-06-06: 0.5 mL via SUBCUTANEOUS
  Filled 2017-06-06: qty 0.5

## 2017-06-06 MED ORDER — WITCH HAZEL-GLYCERIN EX PADS: 1.0000 "application " | MEDICATED_PAD | CUTANEOUS | 12 refills | Status: AC | PRN

## 2017-06-06 MED ORDER — BENZOCAINE-MENTHOL 20-0.5 % EX AERO
1.0000 | INHALATION_SPRAY | CUTANEOUS | 0 refills | Status: AC | PRN
Start: 2017-06-06 — End: ?

## 2017-06-06 MED ORDER — ACETAMINOPHEN 325 MG PO TABS: 650.0000 mg | ORAL_TABLET | ORAL | 0 refills | Status: AC | PRN

## 2017-06-06 MED ORDER — TETANUS-DIPHTH-ACELL PERTUSSIS 5-2.5-18.5 LF-MCG/0.5 IM SUSP: 0.5000 mL | Freq: Once | INTRAMUSCULAR | 0 refills | Status: AC

## 2017-06-06 MED ORDER — DIBUCAINE 1 % RE OINT
1.0000 | TOPICAL_OINTMENT | RECTAL | 0 refills | Status: AC | PRN
Start: 2017-06-06 — End: ?

## 2017-06-06 MED ORDER — MEASLES, MUMPS & RUBELLA VAC ~~LOC~~ INJ: 0.5000 mL | INJECTION | Freq: Once | SUBCUTANEOUS | 0 refills | Status: AC

## 2017-06-06 NOTE — Discharge Instructions (Signed)
Vaginal Delivery, Care After °Refer to this sheet in the next few weeks. These instructions provide you with information about caring for yourself after vaginal delivery. Your health care provider may also give you more specific instructions. Your treatment has been planned according to current medical practices, but problems sometimes occur. Call your health care provider if you have any problems or questions. °What can I expect after the procedure? °After vaginal delivery, it is common to have: °· Some bleeding from your vagina. °· Soreness in your abdomen, your vagina, and the area of skin between your vaginal opening and your anus (perineum). °· Pelvic cramps. °· Fatigue. ° °Follow these instructions at home: °Medicines °· Take over-the-counter and prescription medicines only as told by your health care provider. °· If you were prescribed an antibiotic medicine, take it as told by your health care provider. Do not stop taking the antibiotic until it is finished. °Driving ° °· Do not drive or operate heavy machinery while taking prescription pain medicine. °· Do not drive for 24 hours if you received a sedative. °Lifestyle °· Do not drink alcohol. This is especially important if you are breastfeeding or taking medicine to relieve pain. °· Do not use tobacco products, including cigarettes, chewing tobacco, or e-cigarettes. If you need help quitting, ask your health care provider. °Eating and drinking °· Drink at least 8 eight-ounce glasses of water every day unless you are told not to by your health care provider. If you choose to breastfeed your baby, you may need to drink more water than this. °· Eat high-fiber foods every day. These foods may help prevent or relieve constipation. High-fiber foods include: °? Whole grain cereals and breads. °? Brown rice. °? Beans. °? Fresh fruits and vegetables. °Activity °· Return to your normal activities as told by your health care provider. Ask your health care provider  what activities are safe for you. °· Rest as much as possible. Try to rest or take a nap when your baby is sleeping. °· Do not lift anything that is heavier than your baby or 10 lb (4.5 kg) until your health care provider says that it is safe. °· Talk with your health care provider about when you can engage in sexual activity. This may depend on your: °? Risk of infection. °? Rate of healing. °? Comfort and desire to engage in sexual activity. °Vaginal Care °· If you have an episiotomy or a vaginal tear, check the area every day for signs of infection. Check for: °? More redness, swelling, or pain. °? More fluid or blood. °? Warmth. °? Pus or a bad smell. °· Do not use tampons or douches until your health care provider says this is safe. °· Watch for any blood clots that may pass from your vagina. These may look like clumps of dark red, brown, or black discharge. °General instructions °· Keep your perineum clean and dry as told by your health care provider. °· Wear loose, comfortable clothing. °· Wipe from front to back when you use the toilet. °· Ask your health care provider if you can shower or take a bath. If you had an episiotomy or a perineal tear during labor and delivery, your health care provider may tell you not to take baths for a certain length of time. °· Wear a bra that supports your breasts and fits you well. °· If possible, have someone help you with household activities and help care for your baby for at least a few days after   you leave the hospital. °· Keep all follow-up visits for you and your baby as told by your health care provider. This is important. °Contact a health care provider if: °· You have: °? Vaginal discharge that has a bad smell. °? Difficulty urinating. °? Pain when urinating. °? A sudden increase or decrease in the frequency of your bowel movements. °? More redness, swelling, or pain around your episiotomy or vaginal tear. °? More fluid or blood coming from your episiotomy or  vaginal tear. °? Pus or a bad smell coming from your episiotomy or vaginal tear. °? A fever. °? A rash. °? Little or no interest in activities you used to enjoy. °? Questions about caring for yourself or your baby. °· Your episiotomy or vaginal tear feels warm to the touch. °· Your episiotomy or vaginal tear is separating or does not appear to be healing. °· Your breasts are painful, hard, or turn red. °· You feel unusually sad or worried. °· You feel nauseous or you vomit. °· You pass large blood clots from your vagina. If you pass a blood clot from your vagina, save it to show to your health care provider. Do not flush blood clots down the toilet without having your health care provider look at them. °· You urinate more than usual. °· You are dizzy or light-headed. °· You have not breastfed at all and you have not had a menstrual period for 12 weeks after delivery. °· You have stopped breastfeeding and you have not had a menstrual period for 12 weeks after you stopped breastfeeding. °Get help right away if: °· You have: °? Pain that does not go away or does not get better with medicine. °? Chest pain. °? Difficulty breathing. °? Blurred vision or spots in your vision. °? Thoughts about hurting yourself or your baby. °· You develop pain in your abdomen or in one of your legs. °· You develop a severe headache. °· You faint. °· You bleed from your vagina so much that you fill two sanitary pads in one hour. °This information is not intended to replace advice given to you by your health care provider. Make sure you discuss any questions you have with your health care provider. °Document Released: 12/23/1999 Document Revised: 06/08/2015 Document Reviewed: 01/09/2015 °Elsevier Interactive Patient Education © 2018 [employer] Inc. ° °

## 2017-06-06 NOTE — Discharge Summary (Addendum)
OB Discharge Summary     Patient Name: Rebekah Avery DOB: [birth year] MRN: [id number] Date of admission: 06/04/2017  Delivering MD: N/A Home Delivery   Date of discharge: 06/06/2017   Admitting diagnosis:  del at home Intrauterine pregnancy: Unknown    Secondary diagnosis:  Active Problems:   Patient Active Problem List   Diagnosis Date Noted  . SVD (spontaneous vaginal delivery) 06/04/2017    Additional problems: Did not know she was pregnant. Unknown GA     Discharge diagnosis: Pregnancy Delivered                                                                                                Post partum procedures:None  Complications: None  Hospital course:  Onset of Labor With Vaginal Delivery     22 y.o. yo G2P0011 at Unknown with no PNC was admitted on 06/04/2017 after home delivery of live infant.  Patient was unaware she was pregnant and delivered the baby at home in bathtub at 2:50am.  The placenta was delivered and no repairs of labial lacerations were necessary.    Membrane Rupture Time/Date: 2:50 AM ,06/04/2017   Intrapartum Procedures: Episiotomy: None [1]                                         Lacerations:  Labial [10]  Patient had a delivery of a Viable infant.  06/04/2017  Information for the patient's newborn:  Rebekah Avery, Perine Girl Rebekah Avery [[id number]]  Delivery Method: Vaginal, Spontaneous(Filed from Delivery Summary)    Patient had an uncomplicated postpartum course.  She is ambulating, tolerating a regular diet, passing flatus, and urinating well. She refused the Tdap (Boostrix) vaccine.  Patient is non-immune for rubella.  Patient was seen by SW for +UDS for [facility].  Per SW note there are no barriers to discharge.  Patient reports a good support system at home.  Patient is discharged home in stable condition on 06/06/17.  Physical exam  Vitals:   06/05/17 1711 06/06/17 0526  BP: 117/76 116/74  Pulse: (!) 59 (!) 59  Resp: 18 17  Temp: 98.7 F (37.1 C) 98.1 F (36.7 C)   SpO2: 100%     General: alert, cooperative and no distress Lochia: appropriate Uterine Fundus: firm Incision: N/A DVT Evaluation: No evidence of DVT seen on physical exam.  Labs: No results found for this or any previous visit (from the past 24 hour(s)).   Discharge instruction: per After Visit Summary and "Baby and Me Booklet".  After visit meds:  No Known Allergies  Allergies as of 06/06/2017   No Known Allergies     Medication List    STOP taking these medications   BLISOVI 24 FE 1-20 MG-MCG(24) tablet Generic drug:  Norethindrone Acetate-Ethinyl Estrad-FE     TAKE these medications   acetaminophen 325 MG tablet Commonly known as:  TYLENOL Take 2 tablets (650 mg total) by mouth every 4 (four) hours as needed for mild pain or fever.   benzocaine-Menthol 20-0.5 % Aero Commonly  known as:  DERMOPLAST Apply 1 application topically as needed for irritation (perineal discomfort).   dibucaine 1 % Oint Commonly known as:  NUPERCAINAL Place 1 application rectally as needed for hemorrhoids.   ibuprofen 600 MG tablet Commonly known as:  ADVIL,MOTRIN Take 1 tablet (600 mg total) by mouth every 6 (six) hours. What changed:    medication strength  how much to take  when to take this  reasons to take this   measles, mumps and rubella vaccine injection Commonly known as:  MMR Inject 0.5 mLs into the skin once for 1 dose.   Tdap 5-2.5-18.5 LF-MCG/0.5 injection Commonly known as:  BOOSTRIX Inject 0.5 mLs into the muscle once for 1 dose.   witch hazel-glycerin pad Commonly known as:  TUCKS Apply 1 application topically as needed for hemorrhoids.      Diet: routine diet  Activity: Advance as tolerated. Pelvic rest for 6 weeks.   Outpatient follow up:2 weeks Future Appointments: No future appointments.  Follow up Appt: Message sent to [facility] pool for follow-up.  Postpartum contraception: Undecided and patient will discuss with OBGYN at f/u appointment  Baby  Feeding: Breast Disposition:home with mother  Bonnita Hollow, MD  06/06/2017   OB FELLOW DISCHARGE ATTESTATION  I have seen and examined this patient. I agree with above documentation and have made edits as needed.   Luiz Blare, DO OB Fellow 2:12 PM

## 2017-06-06 NOTE — Lactation Note (Signed)
This note was copied from a baby's chart. Lactation Consultation Note  Patient Name: Rebekah Avery [id number] Date: 06/06/2017 Reason for consult: Follow-up assessment;Infant weight loss  Baby is 57 hours old  LC reviewed and updated the doc flow sheets.  Per mom having some soreness, breast are fuller and hearing more swallows. LC reviewed breast feeding Basics with mom, sore nipple and engorgement prevention and tx.  LC instructed mom on the use comfort gels, breast shells, hand pump, increased  Flange to #27 if needed.  Per mom feels good about the football position. LC reviewed the cross cradle. Baby asleep  Unable to show mom.  Mother informed of post-discharge support and given phone number to the lactation department, including services for phone call assistance; out-patient appointments; and breastfeeding support group. List of other breastfeeding resources in the community given in the handout. Encouraged mother to call for problems or concerns related to breastfeeding.    Maternal Data Has patient been taught Hand Expression?: Yes  Feeding Feeding Type: (last fed 0900 ) Length of feed: 15 min(per mom , swallows )  LATCH Score                   Interventions Interventions: Breast feeding basics reviewed  Lactation Tools Discussed/Used Tools: Shells;Pump;Coconut oil;Comfort gels Shell Type: Inverted Breast pump type: Manual Pump Review: Setup, frequency, and cleaning;Milk Storage Initiated by:: MAI  Date initiated:: 06/06/17   Consult Status Consult Status: Complete Date: 06/06/17 Follow-up type: In-patient    [name] 06/06/2017, 9:56 AM

## 2017-06-07 LAB — HEPATITIS B SURFACE ANTIGEN: HEP B S AG: NEGATIVE

## 2017-07-09 ENCOUNTER — Ambulatory Visit: Payer: 59 | Admitting: Advanced Practice Midwife

## 2018-07-24 ENCOUNTER — Other Ambulatory Visit (HOSPITAL_COMMUNITY)
Admission: RE | Admit: 2018-07-24 | Discharge: 2018-07-24 | Disposition: A | Discharge status: Home / Self Care | Payer: 59 | Source: Ambulatory Visit | Attending: Obstetrics and Gynecology | Admitting: Obstetrics and Gynecology

## 2018-07-24 ENCOUNTER — Other Ambulatory Visit: Payer: Self-pay | Admitting: Obstetrics and Gynecology

## 2018-07-24 DIAGNOSIS — Z124 Encounter for screening for malignant neoplasm of cervix: Secondary | ICD-10-CM | POA: Insufficient documentation

## 2018-07-29 LAB — CYTOLOGY - PAP
Chlamydia: NEGATIVE
Diagnosis: NEGATIVE
Neisseria Gonorrhea: NEGATIVE
